# Patient Record
Sex: Male | Born: 1955 | Race: White | Hispanic: No | Marital: Married | Smoking: Former smoker
Health system: Southern US, Community
[De-identification: ages and names within clinical notes are randomized; demographics above are authoritative.]

---

## 2020-11-18 ENCOUNTER — Emergency Department
Admission: EM | Admit: 2020-11-18 | Discharge: 2020-11-18 | Disposition: A | Discharge status: Home / Self Care | Payer: Federal, State, Local not specified - PPO | Source: Home / Self Care

## 2020-11-18 ENCOUNTER — Emergency Department (INDEPENDENT_AMBULATORY_CARE_PROVIDER_SITE_OTHER): Payer: Federal, State, Local not specified - PPO

## 2020-11-18 ENCOUNTER — Telehealth: Payer: Self-pay | Admitting: Emergency Medicine

## 2020-11-18 DIAGNOSIS — J069 Acute upper respiratory infection, unspecified: Secondary | ICD-10-CM

## 2020-11-18 DIAGNOSIS — R059 Cough, unspecified: Secondary | ICD-10-CM | POA: Diagnosis not present

## 2020-11-18 DIAGNOSIS — R0602 Shortness of breath: Secondary | ICD-10-CM | POA: Diagnosis not present

## 2020-11-18 DIAGNOSIS — R6889 Other general symptoms and signs: Secondary | ICD-10-CM

## 2020-11-18 NOTE — ED Triage Notes (Signed)
Pt presents with coughing, wheezing, HA, body aches since Monday. Pt baby sits grandchildren and one of the grandchildren has been sick as well. Pt has taken tylenol, mucinex  Nyquil otc and has been using a humidifier. Pt had covid vaccine, booster, and flu shot

## 2020-11-18 NOTE — ED Provider Notes (Signed)
[facility]    CSN: [id number] Arrival date & time: 11/18/20  1001      History   Chief Complaint Chief Complaint  Patient presents with  . Generalized Body Aches    HPI Alexander Daniel is a 64 y.o. male.   HPI  Alexander Daniel is a 64 y.o. male presenting to UC with c/o cough, congestion, generalized HA, body aches and wheeze for 5 days.  States he was babysitting his 2yo grandson who was recently treated for an ear infection and was being brought back to the pediatrician today.  He has been taking Tylenol, Mucinex and Nyquil and using a humidifier with mild relief.  He has received the COVID vaccine and booster as well as flu vaccine about 2 weeks ago.  Denies chest pain, n/v/d. No hx of asthma or COPD.  No leg pain or swelling. No hx of clots.    History reviewed. No pertinent past medical history.  There are no problems to display for this patient.   History reviewed. No pertinent surgical history.     Home Medications    Prior to Admission medications   Not on File    Family History Family History  Problem Relation Age of Onset  . Cancer Father     Social History Social History   Tobacco Use  . Smoking status: Former Games developer  . Smokeless tobacco: Never Used  Substance Use Topics  . Alcohol use: Yes    Comment: occanial   . Drug use: Not on file     Allergies   Penicillins   Review of Systems Review of Systems  Constitutional: Negative for chills and fever.  HENT: Positive for congestion and sore throat. Negative for ear pain, trouble swallowing and voice change.   Respiratory: Positive for cough and wheezing. Negative for shortness of breath.   Cardiovascular: Negative for chest pain and palpitations.  Gastrointestinal: Negative for abdominal pain, diarrhea, nausea and vomiting.  Musculoskeletal: Positive for arthralgias, back pain and myalgias.  Skin: Negative for rash.  Neurological: Positive for headaches. Negative for dizziness  and light-headedness.  All other systems reviewed and are negative.    Physical Exam Triage Vital Signs ED Triage Vitals [11/18/20 1058]  Enc Vitals Group     BP (!) 147/90     Pulse Rate (!) 113     Resp 18     Temp 99.4 F (37.4 C)     Temp Source Oral     SpO2 95 %     Weight      Height      Head Circumference      Peak Flow      Pain Score      Pain Loc      Pain Edu?      Excl. in GC?    No data found.  Updated Vital Signs BP (!) 147/90 (BP Location: Left Arm)   Pulse (!) 119   Temp 99.4 F (37.4 C) (Oral)   Resp 18   SpO2 95%   Visual Acuity Right Eye Distance:   Left Eye Distance:   Bilateral Distance:    Right Eye Near:   Left Eye Near:    Bilateral Near:     Physical Exam Vitals and nursing note reviewed.  Constitutional:      General: He is not in acute distress.    Appearance: Normal appearance. He is well-developed. He is obese. He is not ill-appearing, toxic-appearing or diaphoretic.  HENT:  Head: Normocephalic and atraumatic.     Right Ear: Tympanic membrane and ear canal normal.     Left Ear: Tympanic membrane and ear canal normal.     Nose: Nose normal.     Right Sinus: No maxillary sinus tenderness or frontal sinus tenderness.     Left Sinus: No maxillary sinus tenderness or frontal sinus tenderness.     Mouth/Throat:     Lips: Pink.     Mouth: Mucous membranes are moist.     Pharynx: Oropharynx is clear. Uvula midline.  Cardiovascular:     Rate and Rhythm: Regular rhythm. Tachycardia present.  Pulmonary:     Effort: Pulmonary effort is normal. No respiratory distress.     Breath sounds: Normal breath sounds. No stridor. No wheezing, rhonchi or rales.  Musculoskeletal:        General: Normal range of motion.     Cervical back: Normal range of motion and neck supple. No rigidity or tenderness.  Lymphadenopathy:     Cervical: No cervical adenopathy.  Skin:    General: Skin is warm and dry.  Neurological:     Mental Status: He  is alert and oriented to person, place, and time.  Psychiatric:        Behavior: Behavior normal.      UC Treatments / Results  Labs (all labs ordered are listed, but only abnormal results are displayed) Labs Reviewed  COVID-19, FLU A+B AND RSV    EKG   Radiology DG Chest 2 View  Result Date: 11/18/2020 CLINICAL DATA:  cough, congestion, SOB EXAM: CHEST - 2 VIEW COMPARISON:  None. FINDINGS: The heart size and mediastinal contours are within normal limits. Both lungs are clear. No pneumothorax or pleural effusion. The visualized skeletal structures are unremarkable. IMPRESSION: No focal airspace disease. Electronically Signed   By: Stana Bunting M.D.   On: 11/18/2020 12:02    Procedures Procedures (including critical care time)  Medications Ordered in UC Medications - No data to display  Initial Impression / Assessment and Plan / UC Course  I have reviewed the triage vital signs and the nursing notes.  Pertinent labs & imaging results that were available during my care of the patient were reviewed by me and considered in my medical decision making (see chart for details).    Lungs: CTAB  No focal airspace disease on CXR COVID/RSV/Flu test pending Pt is tachycardic, O2 Sat remained at 95% with ambulation. Pt denies chest pain. No leg pain or swelling. No hx of clots. Recent exposure to 2yo grandson with ear infection and URI symptoms this week. Tachycardia likely due to low-grade fever, mild dehydration, and OTC cough/cold medication. Pt states he feels comfortable being discharged home.  Recommend good hydration.  Discussed symptoms that warrant emergent care in the ED. AVS given  Final Clinical Impressions(s) / UC Diagnoses   Final diagnoses:  Flu-like symptoms  Viral upper respiratory tract infection     Discharge Instructions      You may take 500mg  acetaminophen every 4-6 hours or in combination with ibuprofen 400-600mg  every 6-8 hours as needed for  pain, inflammation, and fever.  Be sure to well hydrated with clear liquids and get at least 8 hours of sleep at night, preferably more while sick.   You can try plain mucinex to help loosen up mucous. Combination medication that has dextromethorphan can increase your blood pressure and heart rate.   Please follow up with family medicine in 1 week if needed.  Call 911 or have someone drive you to the hospital if symptoms significantly worsening including trouble breathing, chest pain, dizziness/passing out or other new concerning symptoms develop.       ED Prescriptions    None     PDMP not reviewed this encounter.   Lurene Shadow, [place] 11/18/20 [phone]

## 2020-11-18 NOTE — Telephone Encounter (Signed)
Pt's wife called to see if an inhaler could be called in for [place]. Per Skeet Latch was more short of breath with wheezing - O2 sats are 95% when Darl Pikes has him take deep breaths. Provider was updated & pt was directed to go to ER to rule out a blood clot & for higher level of testing. Pt's wife verbalized an understanding that he should go to the ER now.

## 2020-11-18 NOTE — Discharge Instructions (Signed)
  You may take 500mg  acetaminophen every 4-6 hours or in combination with ibuprofen 400-600mg  every 6-8 hours as needed for pain, inflammation, and fever.  Be sure to well hydrated with clear liquids and get at least 8 hours of sleep at night, preferably more while sick.   You can try plain mucinex to help loosen up mucous. Combination medication that has dextromethorphan can increase your blood pressure and heart rate.   Please follow up with family medicine in 1 week if needed.   Call 911 or have someone drive you to the hospital if symptoms significantly worsening including trouble breathing, chest pain, dizziness/passing out or other new concerning symptoms develop.

## 2020-11-20 LAB — COVID-19, FLU A+B AND RSV
Influenza A, NAA: NOT DETECTED
Influenza B, NAA: NOT DETECTED
RSV, NAA: NOT DETECTED
SARS-CoV-2, NAA: NOT DETECTED

## 2020-11-21 ENCOUNTER — Telehealth: Payer: Self-pay | Admitting: Emergency Medicine

## 2020-11-21 NOTE — Telephone Encounter (Signed)
Called to let pt know results were negative for COVID, Flu & RSV & to see how he was doing. Message left for pt.

## 2021-11-27 IMAGING — DX DG CHEST 2V
2 series · 2 of 2 positions shown · non-contrast
Comparison: None.

CLINICAL DATA: cough, congestion, SOB

EXAM:
CHEST - 2 VIEW

[chest pa]
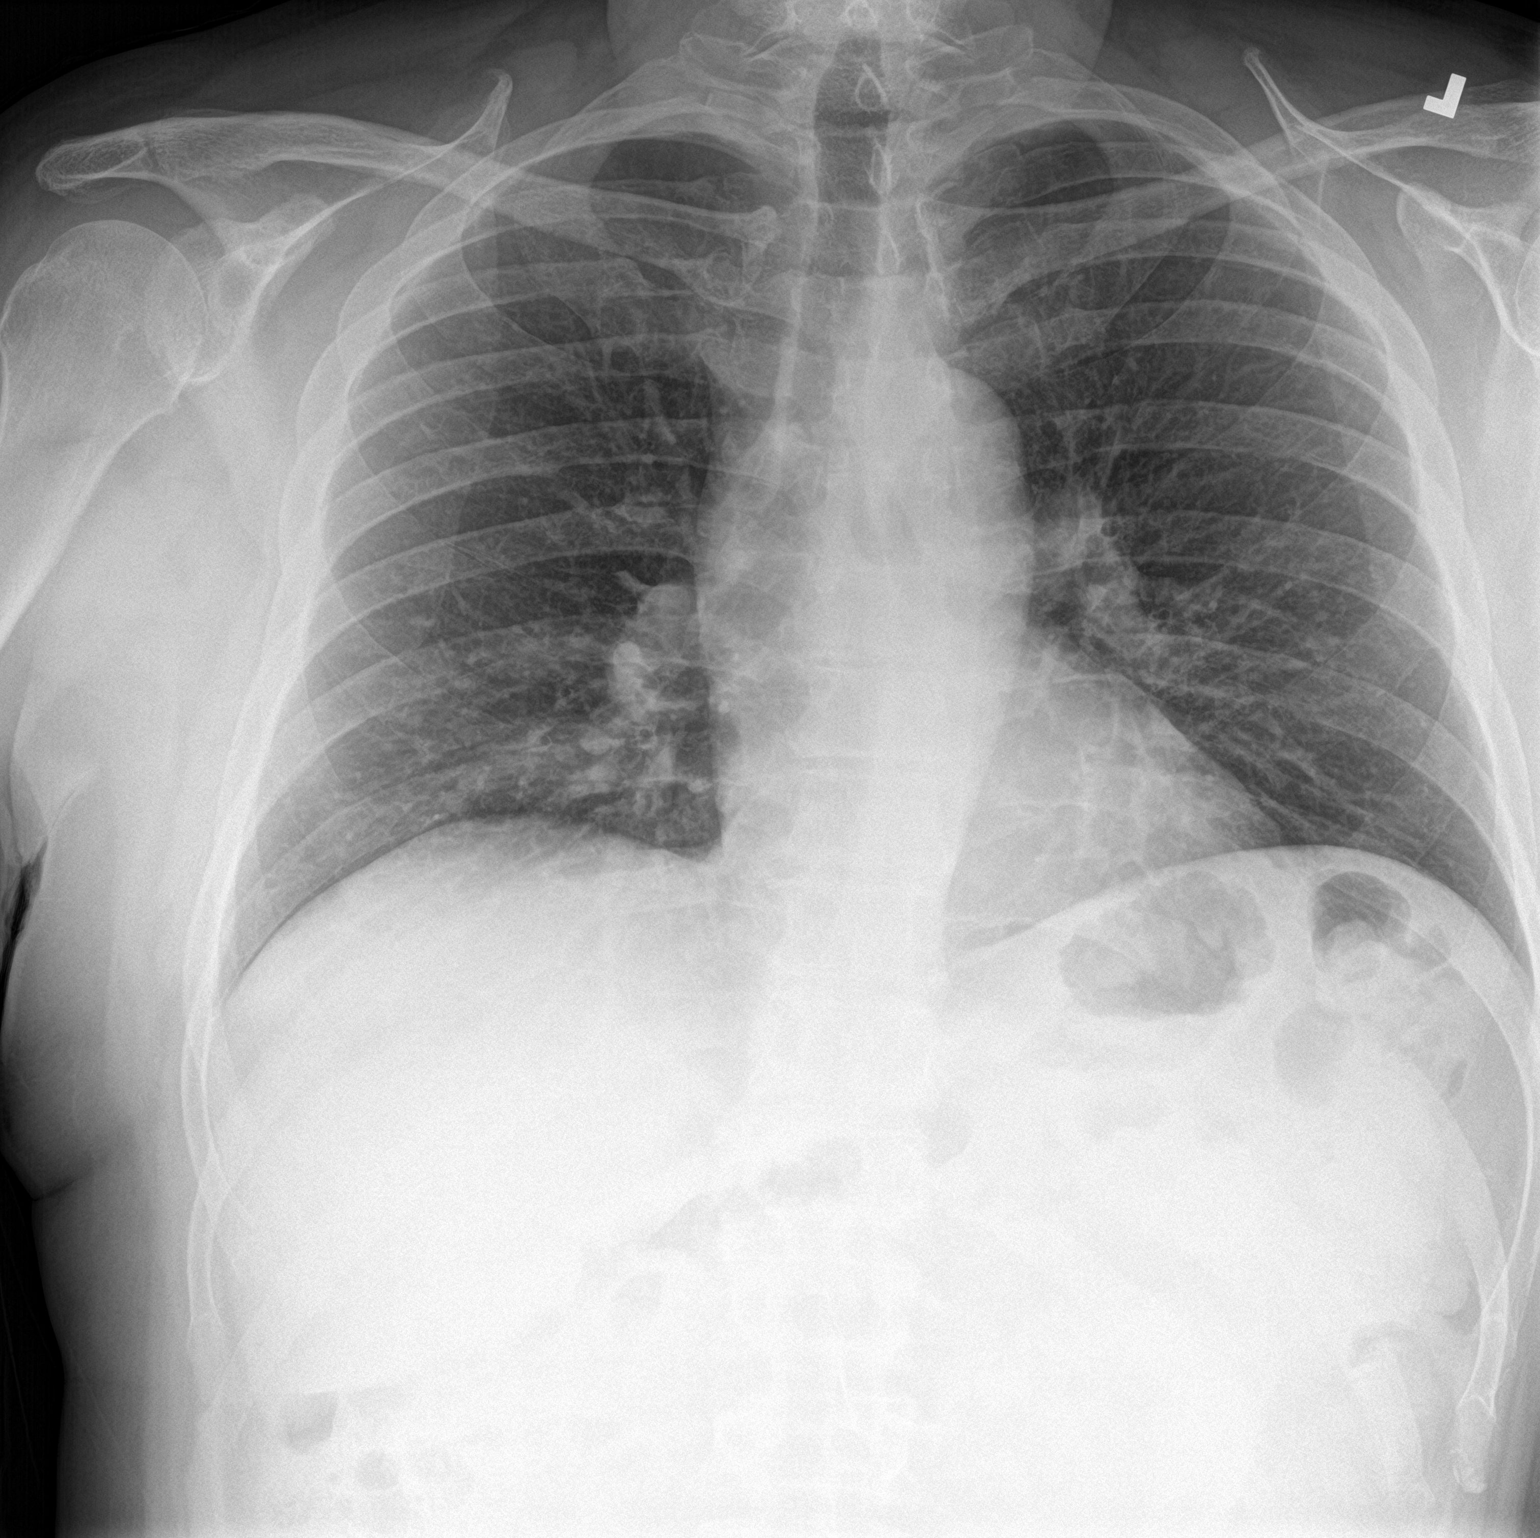

[chest lat]
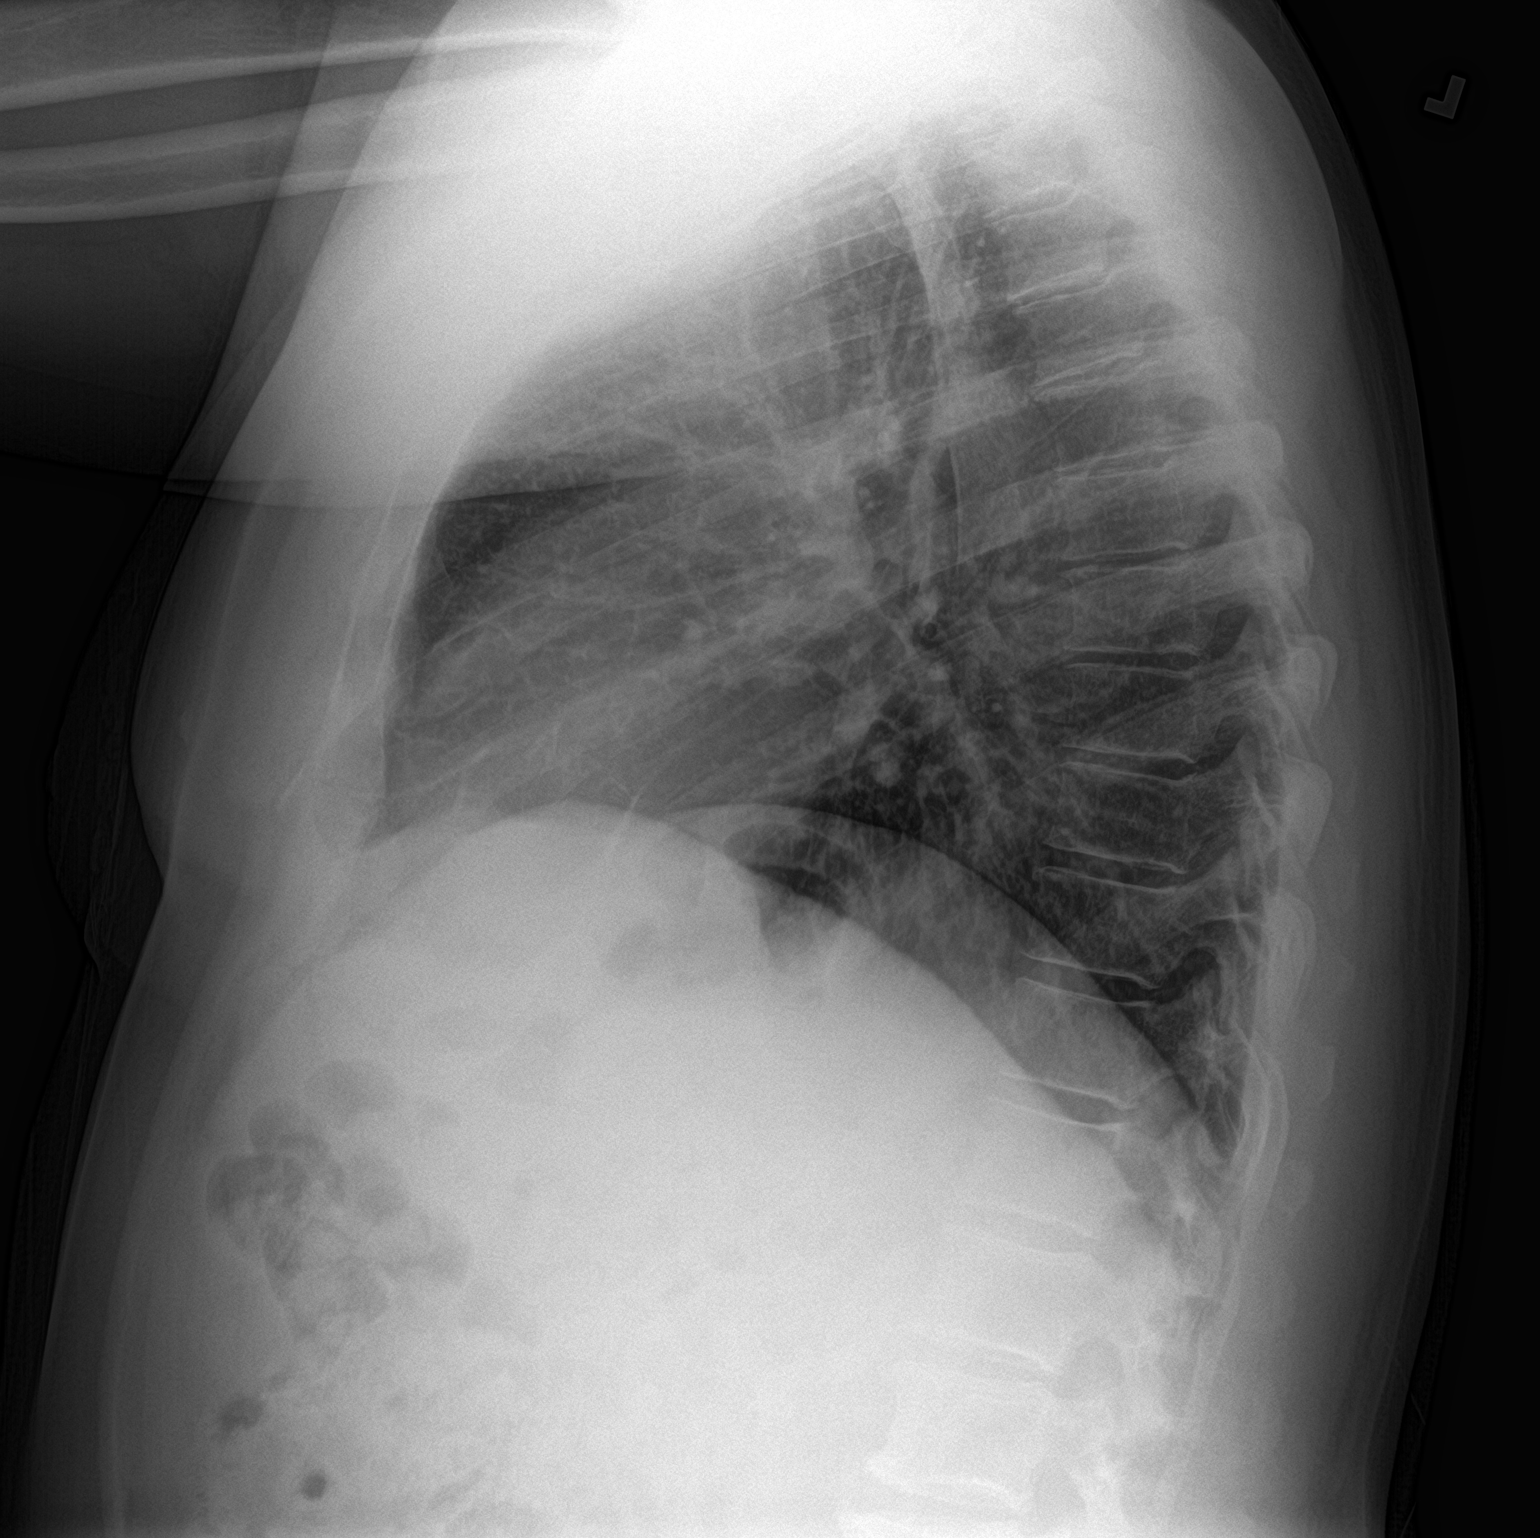

[2 of 2 positions shown; findings below may reference images not displayed]

FINDINGS: The heart size and mediastinal contours are within normal limits.
Both lungs are clear. No pneumothorax or pleural effusion. The
visualized skeletal structures are unremarkable.
IMPRESSION: No focal airspace disease.
